# Patient Record
Sex: Male | Born: 2000 | Race: Black or African American | Hispanic: Yes | Marital: Single
Health system: Southern US, Community
[De-identification: ages and names within clinical notes are randomized; demographics above are authoritative.]

## PROBLEM LIST (undated history)

## (undated) HISTORY — PX: KNEE SURGERY: SHX244

---

## 2018-10-15 ENCOUNTER — Other Ambulatory Visit: Payer: Self-pay | Admitting: *Deleted

## 2018-10-15 DIAGNOSIS — Z20822 Contact with and (suspected) exposure to covid-19: Secondary | ICD-10-CM

## 2018-10-19 ENCOUNTER — Other Ambulatory Visit: Payer: Self-pay

## 2018-10-19 DIAGNOSIS — Z20822 Contact with and (suspected) exposure to covid-19: Secondary | ICD-10-CM

## 2018-10-24 LAB — NOVEL CORONAVIRUS, NAA: SARS-CoV-2, NAA: NOT DETECTED

## 2018-11-09 ENCOUNTER — Other Ambulatory Visit: Payer: Self-pay

## 2018-11-09 DIAGNOSIS — Z20822 Contact with and (suspected) exposure to covid-19: Secondary | ICD-10-CM

## 2018-11-12 LAB — NOVEL CORONAVIRUS, NAA: SARS-CoV-2, NAA: NOT DETECTED

## 2018-12-09 ENCOUNTER — Ambulatory Visit
Admission: RE | Admit: 2018-12-09 | Discharge: 2018-12-09 | Disposition: A | Discharge status: Home / Self Care | Payer: 59 | Source: Ambulatory Visit | Attending: Family Medicine | Admitting: Family Medicine

## 2018-12-09 ENCOUNTER — Ambulatory Visit (INDEPENDENT_AMBULATORY_CARE_PROVIDER_SITE_OTHER): Payer: 59 | Admitting: Family Medicine

## 2018-12-09 ENCOUNTER — Other Ambulatory Visit: Payer: Self-pay | Admitting: Family Medicine

## 2018-12-09 ENCOUNTER — Other Ambulatory Visit: Payer: Self-pay

## 2018-12-09 DIAGNOSIS — R634 Abnormal weight loss: Secondary | ICD-10-CM

## 2018-12-09 DIAGNOSIS — R1030 Lower abdominal pain, unspecified: Secondary | ICD-10-CM

## 2018-12-09 DIAGNOSIS — R58 Hemorrhage, not elsewhere classified: Secondary | ICD-10-CM

## 2018-12-09 LAB — COMPREHENSIVE METABOLIC PANEL
ALT: 23 IU/L (ref 0–44)
AST: 27 IU/L (ref 0–40)
Albumin/Globulin Ratio: 2 (ref 1.2–2.2)
Albumin: 4.6 g/dL (ref 4.1–5.2)
Alkaline Phosphatase: 69 IU/L (ref 39–117)
BUN/Creatinine Ratio: 9 (ref 9–20)
BUN: 13 mg/dL (ref 6–20)
Bilirubin Total: 0.9 mg/dL (ref 0.0–1.2)
CO2: 18 mmol/L — ABNORMAL LOW (ref 20–29)
Calcium: 9.4 mg/dL (ref 8.7–10.2)
Chloride: 104 mmol/L (ref 96–106)
Creatinine, Ser: 1.37 mg/dL — ABNORMAL HIGH (ref 0.76–1.27)
GFR calc Af Amer: 85 mL/min/{1.73_m2} (ref >59)
GFR calc non Af Amer: 74 mL/min/{1.73_m2} (ref >59)
Globulin, Total: 2.3 g/dL (ref 1.5–4.5)
Glucose: 134 mg/dL — ABNORMAL HIGH (ref 65–99)
Potassium: 4 mmol/L (ref 3.5–5.2)
Sodium: 140 mmol/L (ref 134–144)
Total Protein: 6.9 g/dL (ref 6.0–8.5)

## 2018-12-09 LAB — CBC WITH DIFFERENTIAL/PLATELET
Basophils Absolute: 0 10*3/uL (ref 0.0–0.2)
Basos: 1 %
EOS (ABSOLUTE): 0.3 10*3/uL (ref 0.0–0.4)
Eos: 5 %
Hematocrit: 48.9 % (ref 37.5–51.0)
Hemoglobin: 16.7 g/dL (ref 13.0–17.7)
Immature Grans (Abs): 0 10*3/uL (ref 0.0–0.1)
Immature Granulocytes: 0 %
Lymphocytes Absolute: 1.5 10*3/uL (ref 0.7–3.1)
Lymphs: 28 %
MCH: 31.7 pg (ref 26.6–33.0)
MCHC: 34.2 g/dL (ref 31.5–35.7)
MCV: 93 fL (ref 79–97)
Monocytes Absolute: 0.4 10*3/uL (ref 0.1–0.9)
Monocytes: 8 %
Neutrophils Absolute: 3.3 10*3/uL (ref 1.4–7.0)
Neutrophils: 58 %
Platelets: 198 10*3/uL (ref 150–450)
RBC: 5.27 x10E6/uL (ref 4.14–5.80)
RDW: 12.8 % (ref 11.6–15.4)
WBC: 5.5 10*3/uL (ref 3.4–10.8)

## 2018-12-09 MED ORDER — IOHEXOL 300 MG/ML  SOLN
100.0000 mL | Freq: Once | INTRAMUSCULAR | Status: AC | PRN
  Administered 2018-12-09: 18:00:00 100 mL via INTRAVENOUS

## 2018-12-17 ENCOUNTER — Other Ambulatory Visit: Payer: Self-pay | Admitting: Family Medicine

## 2018-12-17 DIAGNOSIS — R1031 Right lower quadrant pain: Secondary | ICD-10-CM

## 2018-12-20 NOTE — Progress Notes (Signed)
Patient presents with symptoms of right abdominal/inquinal pain intermittent for the last 2 weeks. Training staff recently noticed small area of ecchymosis in the right lower abdominal area. Denies any trauma. Denies fever, urinary problems, flank pain, diarrhea, constipation, testicular pain. Denies any history of a hernia. Still has appendix. Pain not reproduced with coughing or laughing. Patient has noticed weight loss (approx. 20 lbs) over the last several weeks. He contributes this to decreased appetite and increased activity.   ROS: Negative except mentioned above. Vitals as per Epic  GENERAL: NAD ABD: +BS, quarter sized ecchymosis area in the RLQ, minimal tenderness of this area, no rebound or guarding, no flank tenderness, no inquinal hernia appreciated NEURO: CN II-XII grossly intact   A/P: Right sided abdominal pain - recommend CT Abdomen/Pelvis, no physical activity for now, discussed with training staff, if any acute problems go to the ER for medical evaluation/treatment, patient addresses understanding.   Addendum - CT Abdomen/Pelvis results discussed with patient, patient and parents worried about appendix, no acute concerns noted on CT reading, will refer patient to general surgery for further evaluation/treatment, patient appreciative, if any acute problems patient is to go to the ER.

## 2019-01-06 ENCOUNTER — Other Ambulatory Visit: Payer: Self-pay

## 2019-01-06 DIAGNOSIS — Z20822 Contact with and (suspected) exposure to covid-19: Secondary | ICD-10-CM

## 2019-01-08 LAB — NOVEL CORONAVIRUS, NAA: SARS-CoV-2, NAA: DETECTED — AB

## 2019-01-19 ENCOUNTER — Other Ambulatory Visit: Payer: Self-pay | Admitting: Family Medicine

## 2019-01-19 DIAGNOSIS — Z8619 Personal history of other infectious and parasitic diseases: Secondary | ICD-10-CM

## 2019-01-19 DIAGNOSIS — Z8616 Personal history of COVID-19: Secondary | ICD-10-CM

## 2019-01-19 DIAGNOSIS — I400 Infective myocarditis: Secondary | ICD-10-CM

## 2019-01-21 ENCOUNTER — Other Ambulatory Visit: Payer: BC Managed Care – PPO | Admitting: Family Medicine

## 2019-01-21 ENCOUNTER — Other Ambulatory Visit: Payer: Self-pay

## 2019-01-21 DIAGNOSIS — Z0000 Encounter for general adult medical examination without abnormal findings: Secondary | ICD-10-CM

## 2019-01-22 ENCOUNTER — Ambulatory Visit (INDEPENDENT_AMBULATORY_CARE_PROVIDER_SITE_OTHER): Payer: 59

## 2019-01-22 ENCOUNTER — Other Ambulatory Visit: Payer: Self-pay

## 2019-01-22 DIAGNOSIS — Z8619 Personal history of other infectious and parasitic diseases: Secondary | ICD-10-CM | POA: Diagnosis not present

## 2019-01-22 DIAGNOSIS — I400 Infective myocarditis: Secondary | ICD-10-CM | POA: Diagnosis not present

## 2019-01-22 DIAGNOSIS — U071 COVID-19: Secondary | ICD-10-CM

## 2019-01-22 DIAGNOSIS — Z8616 Personal history of COVID-19: Secondary | ICD-10-CM

## 2019-01-22 LAB — TROPONIN I: Troponin I: <0.01 ng/mL (ref 0.00–0.04)

## 2019-01-27 ENCOUNTER — Other Ambulatory Visit: Payer: BC Managed Care – PPO

## 2020-07-02 IMAGING — CT CT ABDOMEN AND PELVIS WITH CONTRAST
2 of 4 series · 16 of 46 positions shown, 18 images · IV contrast (APPLIED)
Comparison: None.

CLINICAL DATA: Unintended weight loss.  Right lower abdominal pain.

EXAM:
CT ABDOMEN AND PELVIS WITH CONTRAST
TECHNIQUE: Multidetector CT imaging of the abdomen and pelvis was performed
using the standard protocol following bolus administration of
intravenous contrast.
CONTRAST:  100mL OMNIPAQUE IOHEXOL 300 MG/ML  SOLN

[Series 2: axial st · axial · 0.74mm/px · z∈[-634,-164]mm · 13 of 104 slices shown, 15 images]
[im 5/104  soft-tissue]
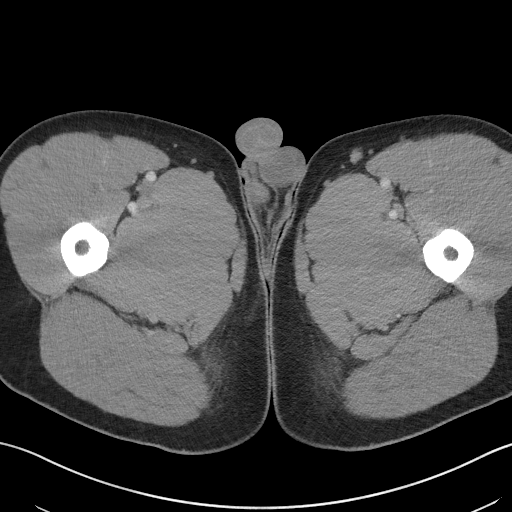
[im 5/104  bone]
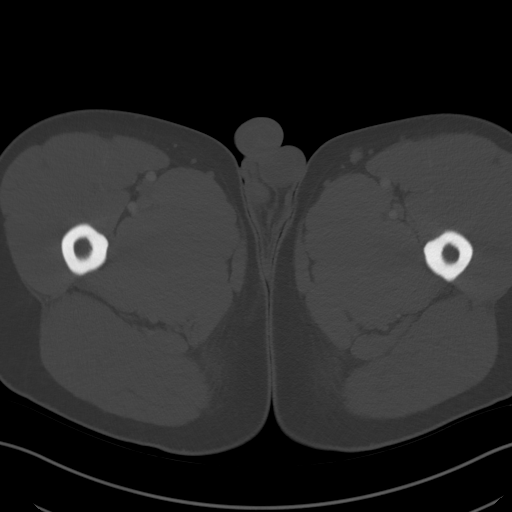
[im 13/104  soft-tissue]
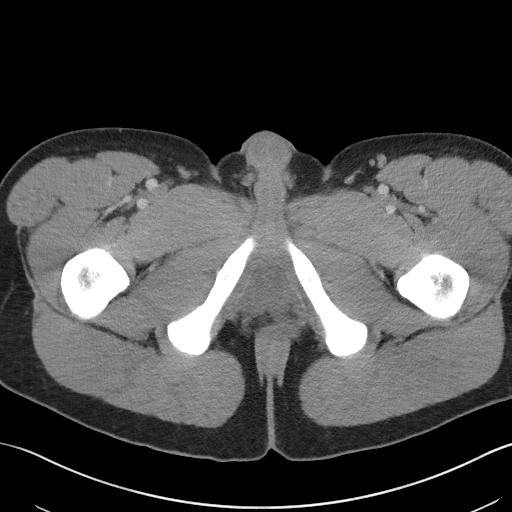
[im 21/104  soft-tissue]
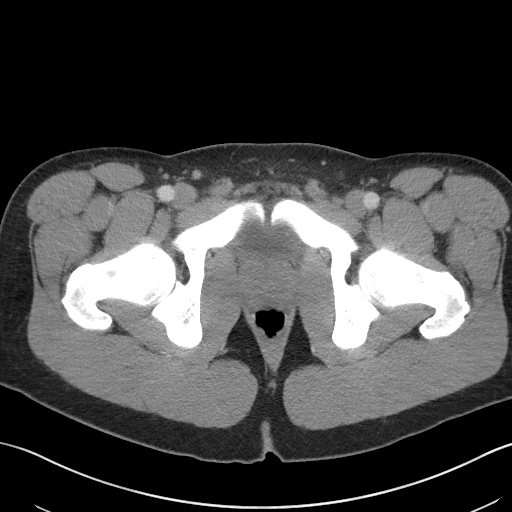
[im 29/104  soft-tissue]
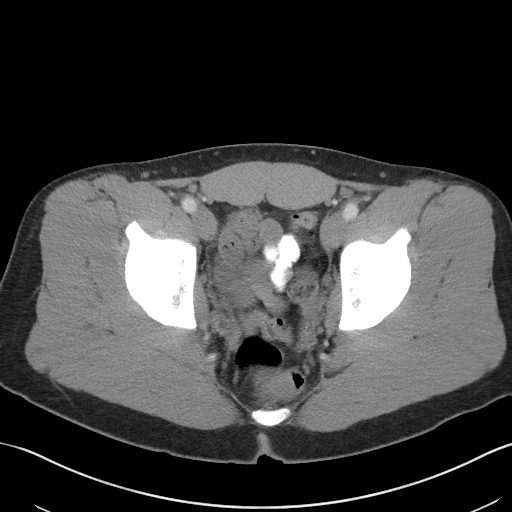
[im 38/104  soft-tissue]
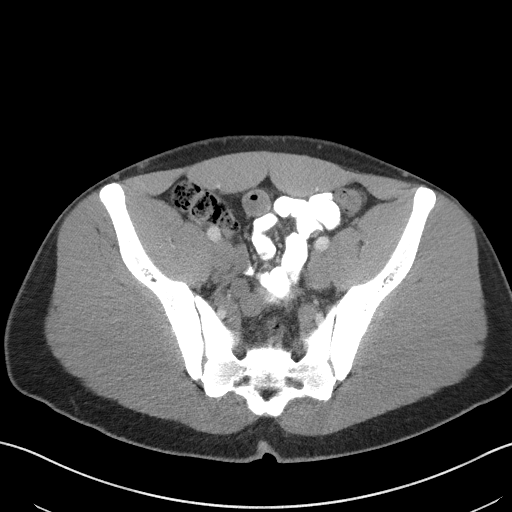
[im 46/104  soft-tissue]
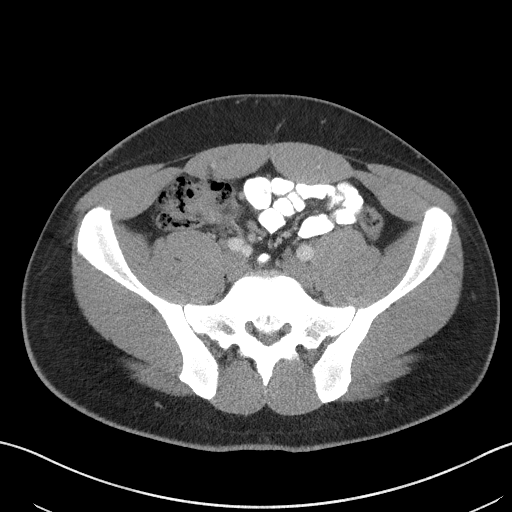
[im 54/104  soft-tissue]
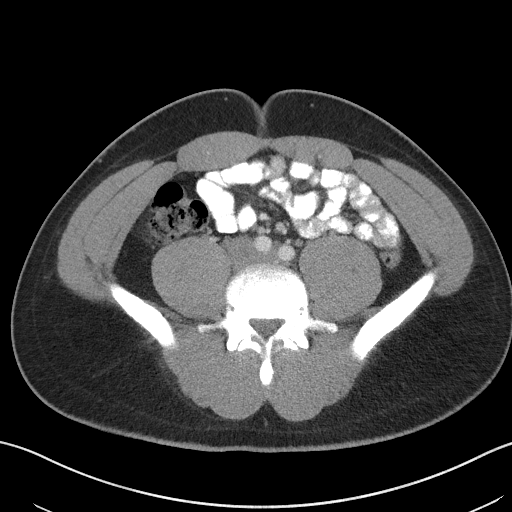
[im 58/104  soft-tissue]
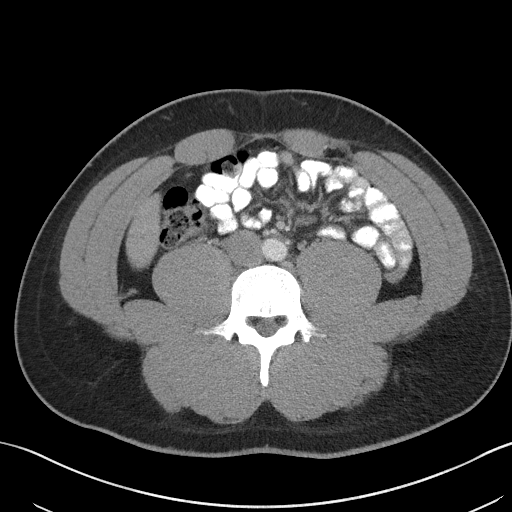
[im 66/104  soft-tissue]
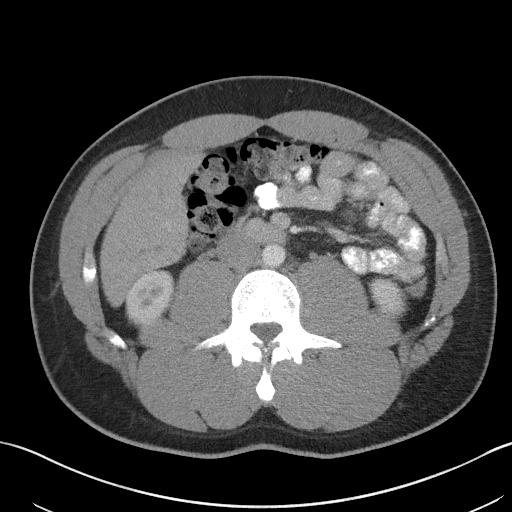
[im 66/104  bone]
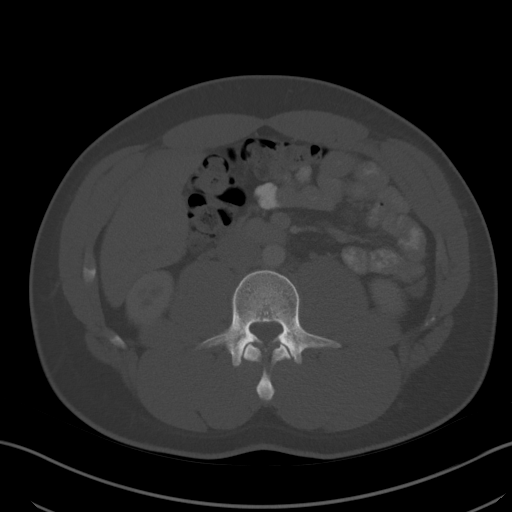
[im 75/104  soft-tissue]
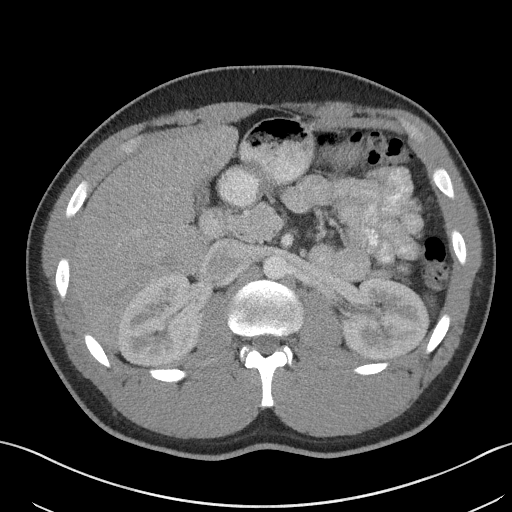
[im 83/104  soft-tissue]
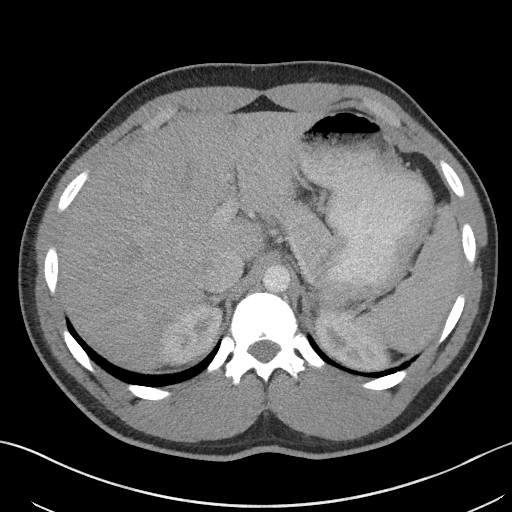
[im 91/104  soft-tissue]
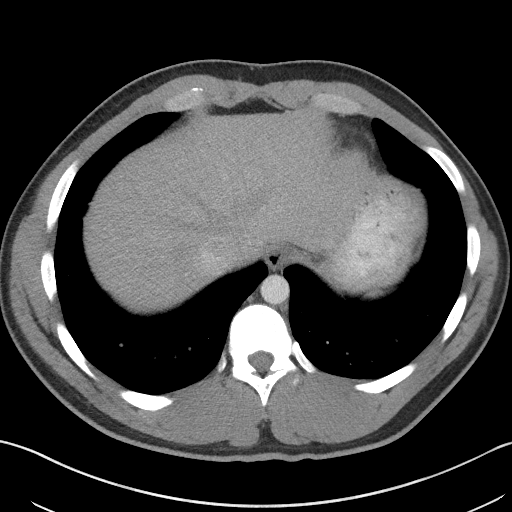
[im 99/104  soft-tissue]
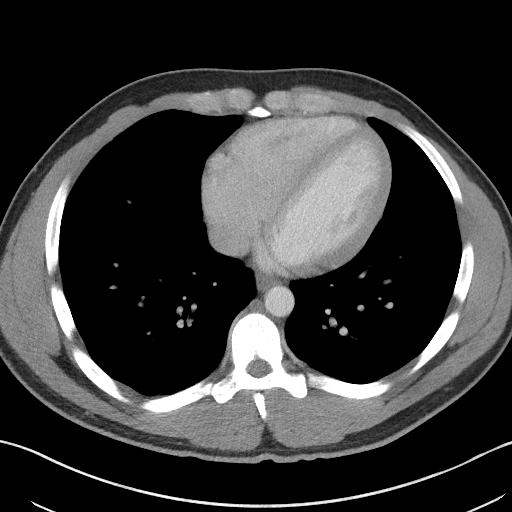

[Series 5: coronal st · coronal · 0.83mm/px · 3 of 100 slices shown]
[im 34/100  soft-tissue]
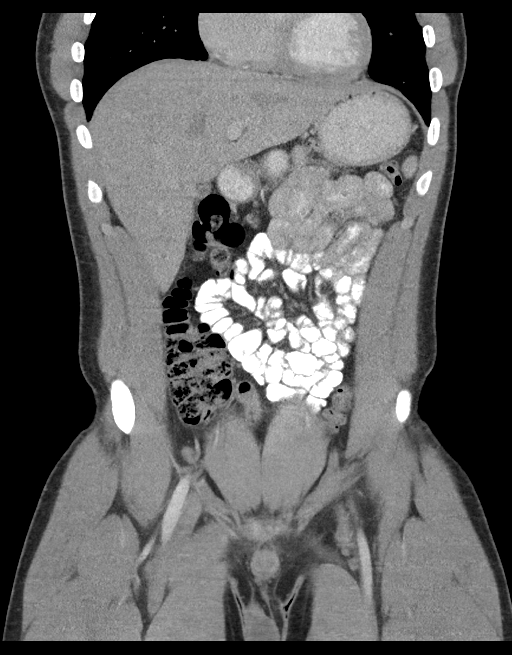
[im 45/100  soft-tissue]
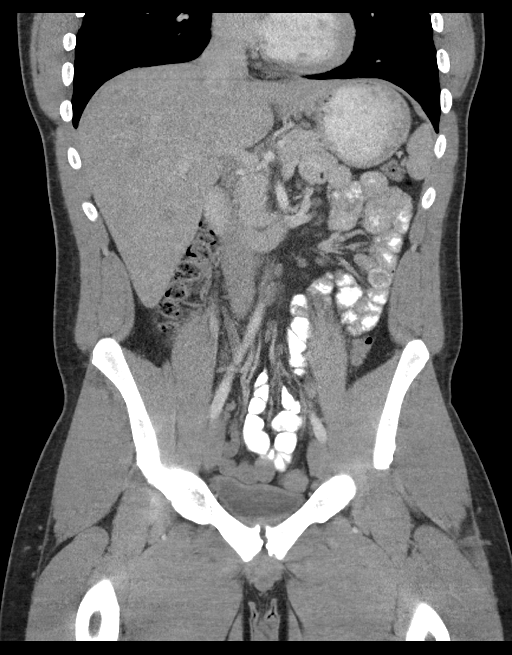
[im 56/100  soft-tissue]
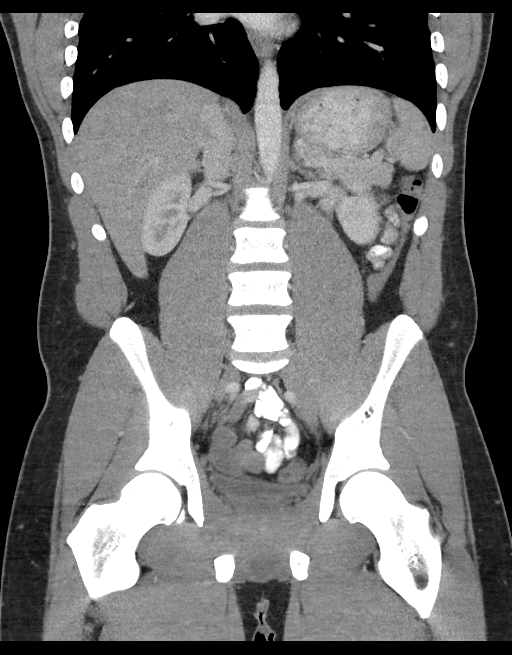

[16 of 46 positions shown; findings below may reference images not displayed]

FINDINGS: Lower chest: Lung bases are clear. No effusions. Heart is normal
size.

Hepatobiliary: No focal hepatic abnormality. Gallbladder
unremarkable.

Pancreas: No focal abnormality or ductal dilatation.

Spleen: No focal abnormality.  Normal size.

Adrenals/Urinary Tract: No adrenal abnormality. No focal renal
abnormality. No stones or hydronephrosis. Urinary bladder is
unremarkable.

Stomach/Bowel: Stomach, large and small bowel grossly unremarkable.
Appendix not visualized, but no pericecal inflammation noted.

Vascular/Lymphatic: No evidence of aneurysm or adenopathy.

Reproductive: No visible focal abnormality.

Other: No free fluid or free air.

Musculoskeletal: No acute bony abnormality.
IMPRESSION: No acute findings or significant abnormality in the abdomen or
pelvis.

## 2021-01-07 ENCOUNTER — Other Ambulatory Visit: Payer: Self-pay

## 2021-01-07 ENCOUNTER — Emergency Department
Admission: EM | Admit: 2021-01-07 | Discharge: 2021-01-07 | Disposition: A | Discharge status: Home / Self Care | Payer: BC Managed Care – PPO | Attending: Emergency Medicine | Admitting: Emergency Medicine

## 2021-01-07 ENCOUNTER — Emergency Department: Payer: BC Managed Care – PPO

## 2021-01-07 ENCOUNTER — Encounter: Payer: Self-pay | Admitting: Emergency Medicine

## 2021-01-07 DIAGNOSIS — S299XXA Unspecified injury of thorax, initial encounter: Secondary | ICD-10-CM | POA: Diagnosis present

## 2021-01-07 DIAGNOSIS — S20211A Contusion of right front wall of thorax, initial encounter: Secondary | ICD-10-CM

## 2021-01-07 DIAGNOSIS — Y9361 Activity, american tackle football: Secondary | ICD-10-CM | POA: Insufficient documentation

## 2021-01-07 DIAGNOSIS — W2101XA Struck by football, initial encounter: Secondary | ICD-10-CM | POA: Insufficient documentation

## 2021-01-07 DIAGNOSIS — R0781 Pleurodynia: Secondary | ICD-10-CM

## 2021-01-07 LAB — BASIC METABOLIC PANEL
Anion gap: 11 (ref 5–15)
BUN: 15 mg/dL (ref 6–20)
CO2: 25 mmol/L (ref 22–32)
Calcium: 9.2 mg/dL (ref 8.9–10.3)
Chloride: 103 mmol/L (ref 98–111)
Creatinine, Ser: 1.06 mg/dL (ref 0.61–1.24)
GFR, Estimated: >60 mL/min (ref >60)
Glucose, Bld: 95 mg/dL (ref 70–99)
Potassium: 3.7 mmol/L (ref 3.5–5.1)
Sodium: 139 mmol/L (ref 135–145)

## 2021-01-07 LAB — CBC
HCT: 44.6 % (ref 39.0–52.0)
Hemoglobin: 15.3 g/dL (ref 13.0–17.0)
MCH: 31.3 pg (ref 26.0–34.0)
MCHC: 34.3 g/dL (ref 30.0–36.0)
MCV: 91.2 fL (ref 80.0–100.0)
Platelets: 254 10*3/uL (ref 150–400)
RBC: 4.89 MIL/uL (ref 4.22–5.81)
RDW: 12.7 % (ref 11.5–15.5)
WBC: 10.3 10*3/uL (ref 4.0–10.5)
nRBC: 0 % (ref 0.0–0.2)

## 2021-01-07 LAB — TROPONIN I (HIGH SENSITIVITY): Troponin I (High Sensitivity): 12 ng/L (ref <18)

## 2021-01-07 MED ORDER — TRAMADOL HCL 50 MG PO TABS: 50.0000 mg | ORAL_TABLET | Freq: Three times a day (TID) | ORAL | 0 refills | Status: AC | PRN

## 2021-01-07 MED ORDER — HYDROCODONE-ACETAMINOPHEN 5-325 MG PO TABS
1.0000 | ORAL_TABLET | Freq: Once | ORAL | Status: AC
  Administered 2021-01-07: 1 via ORAL
  Filled 2021-01-07: qty 1

## 2021-01-07 NOTE — ED Notes (Signed)
Pt ambulatory to bathroom

## 2021-01-07 NOTE — ED Provider Notes (Signed)
[facility] Emergency Department Provider Note ____________________________________________  Time seen: 1000  I have reviewed the triage vital signs and the nursing notes.  HISTORY  Chief Complaint  Chest Pain   HPI Ryan Stokes is a 20 y.o. male presents to the ER today with complaint of right side rib pain.  He reports this started at 1:00 this morning.  He describes the pain as sharp, stabbing and burning.  The pain does not radiate.  He reports some associated numbness but denies tingling in the area.  The pain is worse with movement and with taking a deep breath.  He denies shortness of breath, chest pain, nausea, vomiting, reflux, constipation, diarrhea, blood in his urine or blood in the stool.  He reports he does play [employer] football and did take a hard hit to the chest yesterday.  He has been taking Ibuprofen and Tylenol OTC with minimal relief of symptoms. No past medical history on file.  There are no problems to display for this patient.    Prior to Admission medications   Medication Sig Start Date End Date Taking? Authorizing Provider  traMADol (ULTRAM) 50 MG tablet Take 1 tablet (50 mg total) by mouth every 8 (eight) hours as needed. 01/07/21 01/07/22 Yes BaitySalvadore Oxford, NP    Allergies Other  No family history on file.  Social History    Review of Systems  Constitutional: Negative for fever, chills or body. Cardiovascular: Negative for chest pain or chest tightness. Respiratory: Negative for cough or shortness of breath. Gastrointestinal: Negative for abdominal pain, nausea, vomiting, constipation, diarrhea, or blood in stool. Genitourinary: Negative for blood in his urine. Musculoskeletal: Positive for right side rib pain.  Negative for back pain. Skin: Positive for abrasion to right anterior ribs. Neurological: Positive for numbness of the right ribs.  Negative for headaches, focal weakness or  tingling. ____________________________________________  PHYSICAL EXAM:  VITAL SIGNS: ED Triage Vitals  Enc Vitals Group     BP 01/07/21 0658 117/79     Pulse Rate 01/07/21 0658 70     Resp 01/07/21 0658 16     Temp 01/07/21 0658 98.7 F (37.1 C)     Temp Source 01/07/21 0658 Oral     SpO2 01/07/21 0658 100 %     Weight 01/07/21 0657 260 lb (117.9 kg)     Height 01/07/21 0657 6\' 2"  (1.88 m)     Head Circumference --      Peak Flow --      Pain Score 01/07/21 0654 8     Pain Loc --      Pain Edu? --      Excl. in GC? --     Constitutional: Alert and oriented.  Obese, appears uncomfortable but in no distress. Head: Normocephalic and atraumatic. Eyes:  Normal extraocular movements Cardiovascular: Normal rate, regular rhythm.  Respiratory: Normal respiratory effort. No wheezes/rales/rhonchi noted. Gastrointestinal: Soft and nontender. No distention. Musculoskeletal: Pain with palpation over the anterior eighth rib on the right. Neurologic:   Normal speech and language. No gross focal neurologic deficits are appreciated. Skin:  Skin is warm, dry and intact.  Abrasion noted over the right anterior ribs.  ____________________________________________   LABS  Labs Reviewed  BASIC METABOLIC PANEL  CBC  TROPONIN I (HIGH SENSITIVITY)    ____________________________________________  EKG ED ECG REPORT   Date: 01/07/2021  EKG Time: 10:59 AM  Rate: 71  Rhythm: normal sinus rhythm,  normal EKG,   Axis: normal  Intervals:none  ST&T Change: none  Narrative Interpretation: Normal rate and rhythm, no acute findings   ____________________________________________   RADIOLOGY Imaging Orders         DG Chest 2 View         DG Ribs Unilateral Right     IMPRESSION: No active cardiopulmonary disease.   IMPRESSION: Negative, no right rib fracture or rib lesion identified. Area of concern is at the right 8th rib costochondral junction.   ____________________________________________  INITIAL IMPRESSION / ASSESSMENT AND PLAN / ED COURSE  Acute Right Side Rib Pain:  DDX include rib fracture, rib contusion, costochondritis Chest xray negative  Xray ribs does not show any acute fracture Norco 5-325 mg PO x 1 Advised him to alternate Tylenol and ibuprofen OTC at home Encouraged him to  try heat for comfort Rx for Tramadol 50 mg every 8 hours as needed for severe pain      I reviewed the patient's prescription history over the last 12 months in the multi-state controlled substances database(s) that includes [place], [place], [place], [place], [place], [place], [place], [place], [place], [place], [place], [place], [place], and [place].  Results were notable for no recent controlled substances. ____________________________________________  FINAL CLINICAL IMPRESSION(S) / ED DIAGNOSES  Final diagnoses:  Rib pain  Contusion of rib on right side, initial encounter      Lorre Munroe, NP 01/07/21 1059    Merwyn Katos, MD 01/07/21 1226

## 2021-01-07 NOTE — ED Notes (Signed)
See triage note. Pt ambulatory to room. Pt c/o sharp CP that radiates to right rib cage. Pt stating was playing football and was hit with another players cleats on his right rib cage. Pt endorses SOB. VSS.

## 2021-01-07 NOTE — Discharge Instructions (Addendum)
You were seen today for right-sided rib pain.  Your chest x-ray did not show any acute findings.  The x-ray of your ribs was negative for acute fracture.  You may want to apply heat as needed for comfort.  You may alternate Tylenol 1000 mg and ibuprofen 800 mg over-the-counter every 8 hours as needed for pain and information.  I am giving you a small supply of tramadol to take as needed for severe pain.  I recommend that you do not play sports until this pain has completely resolved.

## 2021-01-07 NOTE — ED Triage Notes (Signed)
Pt to ED via POV with c/o stabbing CP to R chest since 0100 today. Pt states pain worse with breathing/movement. Pt A&O x4, ambulatory without difficulty.

## 2021-01-07 NOTE — ED Notes (Signed)
Patient transported to X-ray 

## 2023-08-18 ENCOUNTER — Other Ambulatory Visit (HOSPITAL_COMMUNITY): Payer: Self-pay
